# Patient Record
Sex: Male | Born: 1995 | Race: White | Hispanic: No | Marital: Single | Smoking: Never smoker
Health system: Southern US, Community
[De-identification: ages and names within clinical notes are randomized; demographics above are authoritative.]

## PROBLEM LIST (undated history)

## (undated) DIAGNOSIS — B019 Varicella without complication: Secondary | ICD-10-CM

## (undated) DIAGNOSIS — F909 Attention-deficit hyperactivity disorder, unspecified type: Secondary | ICD-10-CM

## (undated) DIAGNOSIS — H6690 Otitis media, unspecified, unspecified ear: Secondary | ICD-10-CM

## (undated) DIAGNOSIS — E230 Hypopituitarism: Secondary | ICD-10-CM

## (undated) HISTORY — PX: TYMPANOSTOMY TUBE PLACEMENT: SHX32

## (undated) HISTORY — PX: INGUINAL HERNIA REPAIR: SUR1180

## (undated) HISTORY — PX: NASAL FRACTURE SURGERY: SHX718

## (undated) HISTORY — DX: Otitis media, unspecified, unspecified ear: H66.90

## (undated) HISTORY — DX: Varicella without complication: B01.9

## (undated) HISTORY — DX: Attention-deficit hyperactivity disorder, unspecified type: F90.9

## (undated) HISTORY — DX: Hypopituitarism: E23.0

## (undated) HISTORY — PX: HERNIA REPAIR: SHX51

---
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
## 2016-04-28 IMAGING — CR DG LUMBAR SPINE 2-3V
3 series · 3 of 3 positions shown · non-contrast
Comparison: None.

CLINICAL DATA: Sharp lumbar pain 1 week worse when lying down. No
trauma. No radiculopathy.

EXAM:
LUMBAR SPINE - 2-3 VIEW

[w lumbar spine ap]
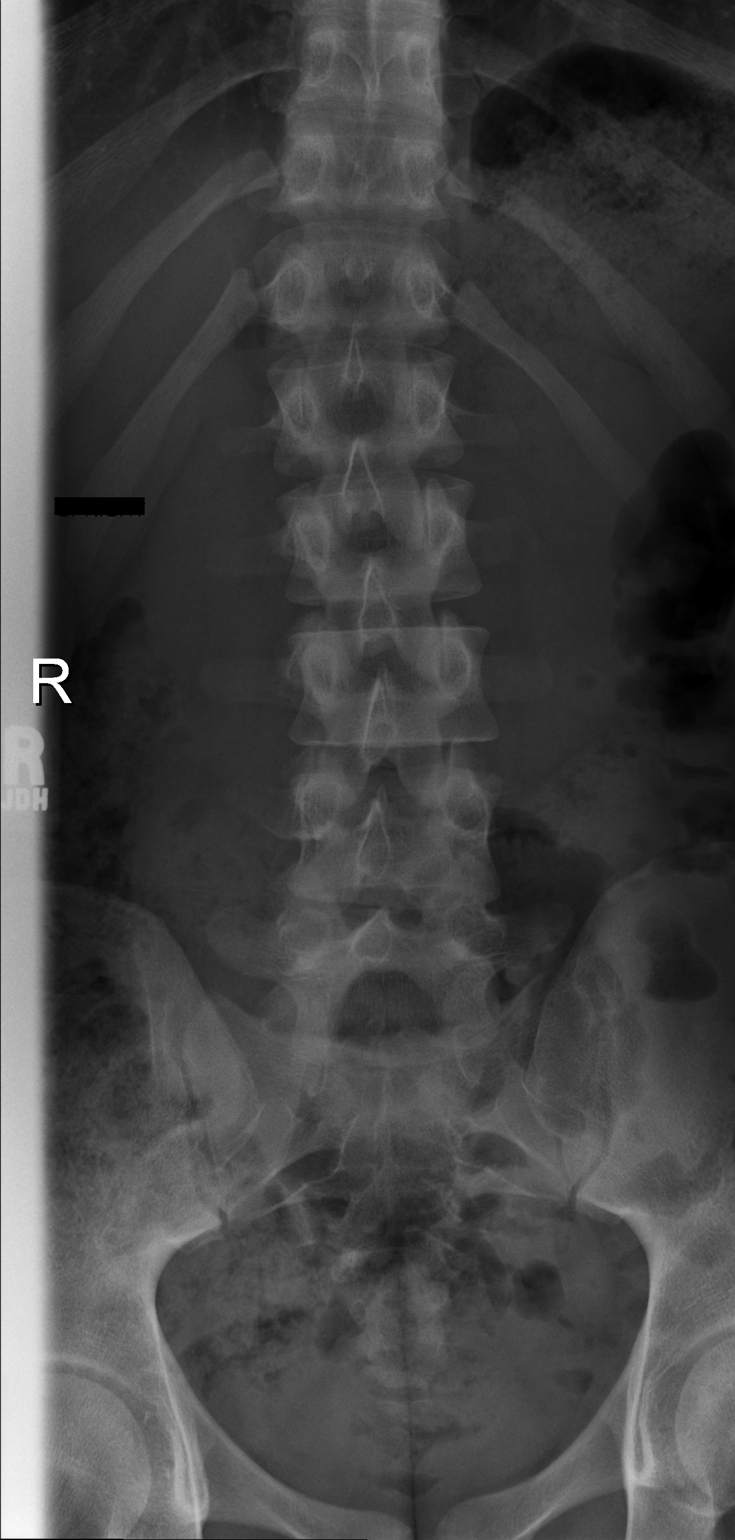

[w lumbar spine lat]
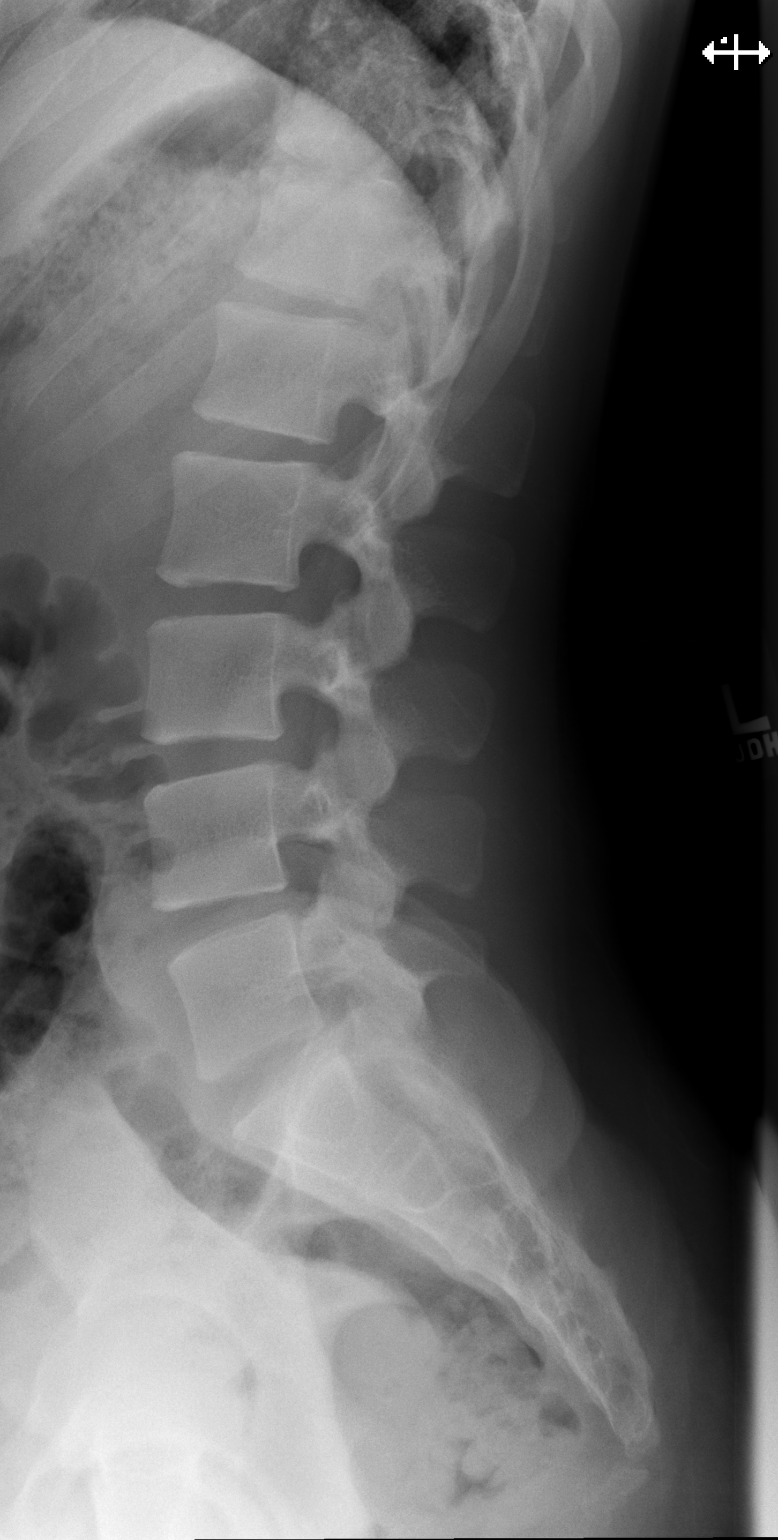

[w lumbar l-5 s-1 spot]
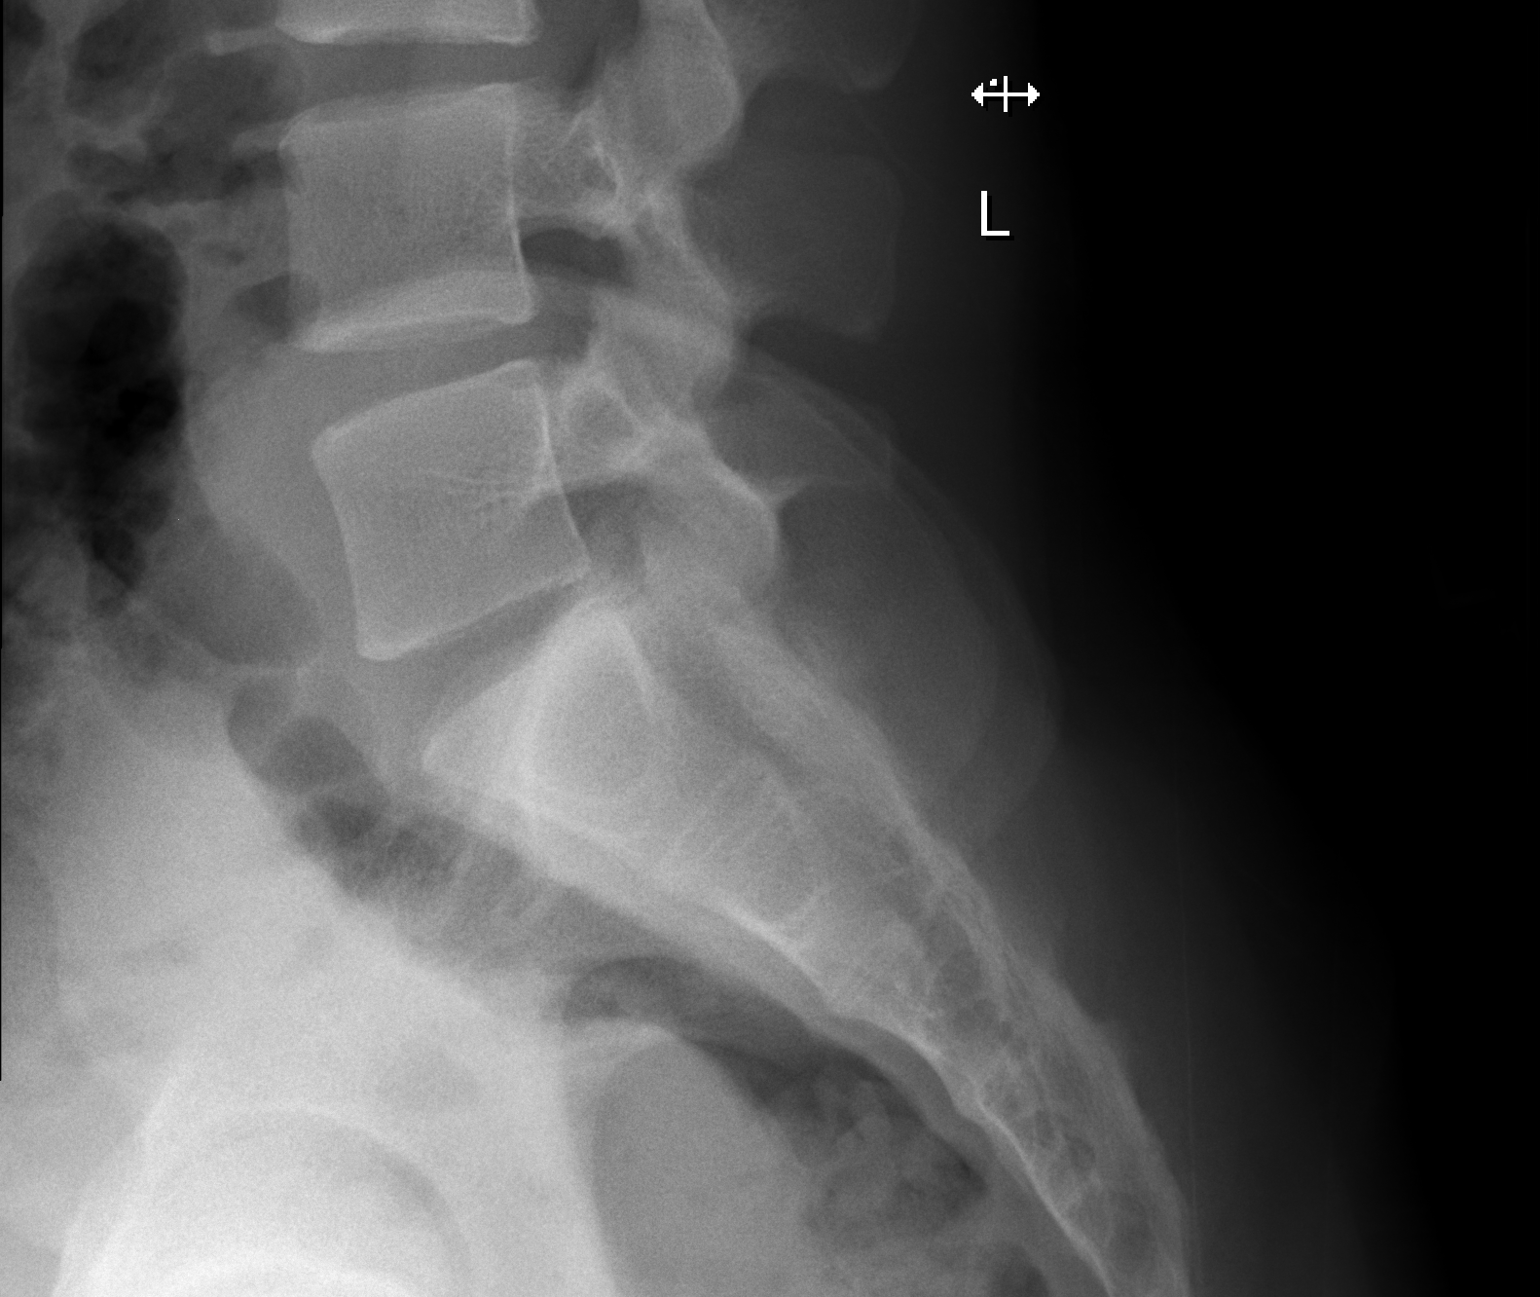

[3 of 3 positions shown; findings below may reference images not displayed]

FINDINGS: There is no evidence of lumbar spine fracture. Alignment is normal.
Intervertebral disc spaces are maintained.
IMPRESSION: Negative.

## 2016-11-22 IMAGING — CT CT HEAD W/O CM
2 series · 15 of 30 positions shown, 19 images · non-contrast
Comparison: 03/19/2015

CLINICAL DATA: Head trauma sustained riding a skateboard with loss
of consciousness. Nausea and vomiting. Followup occipital skull
fractures.

EXAM:
CT HEAD WITHOUT CONTRAST
TECHNIQUE: Contiguous axial images were obtained from the base of the skull
through the vertex without intravenous contrast.

[Series 201: head w/o, idose (1) · axial · non-contrast · 0.43mm/px · z∈[+57,+177]mm · 13 of 30 slices shown, 17 images]
[im 3/30  brain]
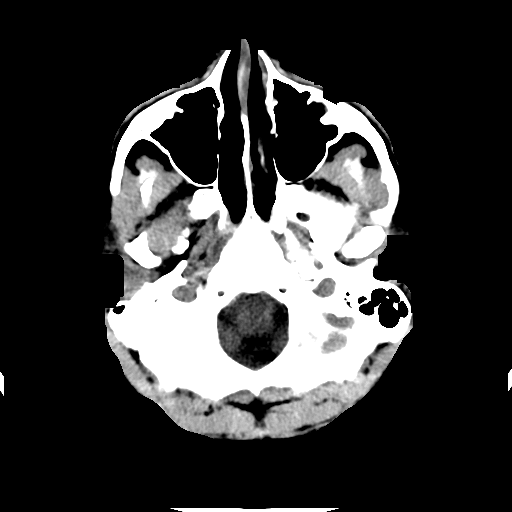
[im 3/30  bone]
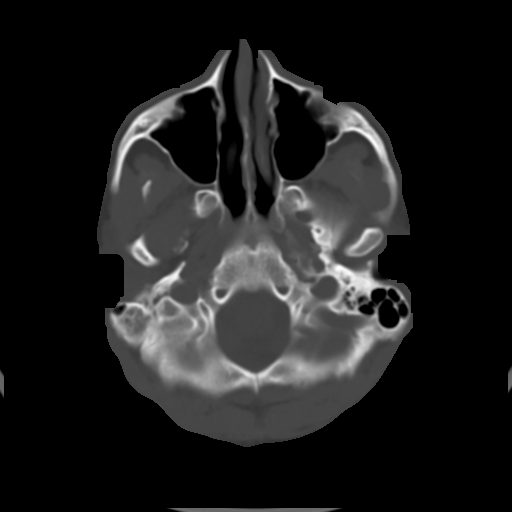
[im 5/30  brain]
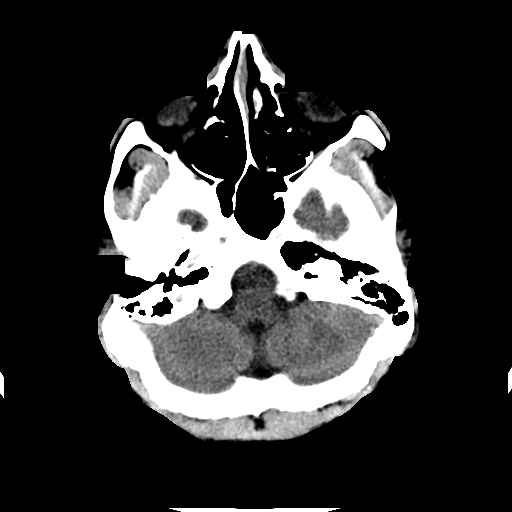
[im 7/30  brain]
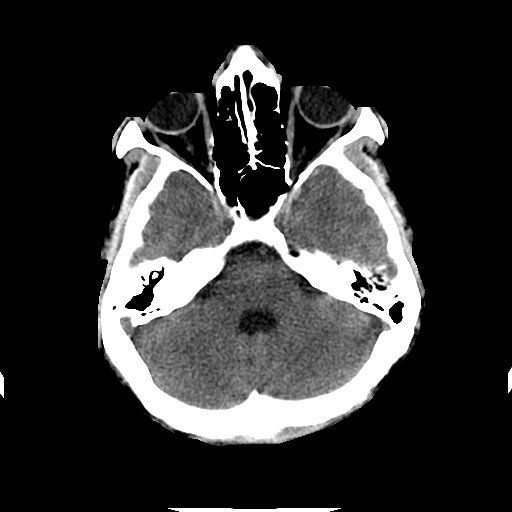
[im 9/30  brain]
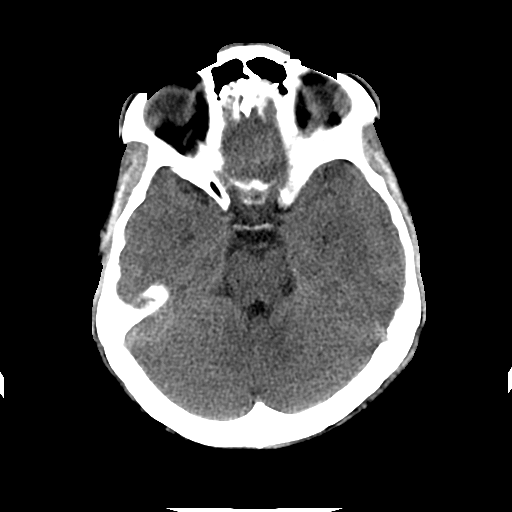
[im 11/30  brain]
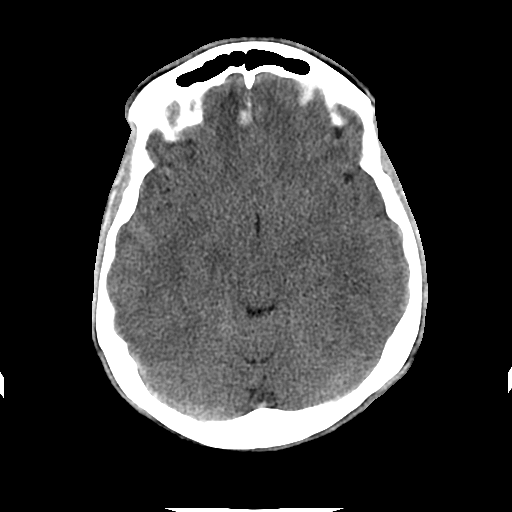
[im 11/30  bone]
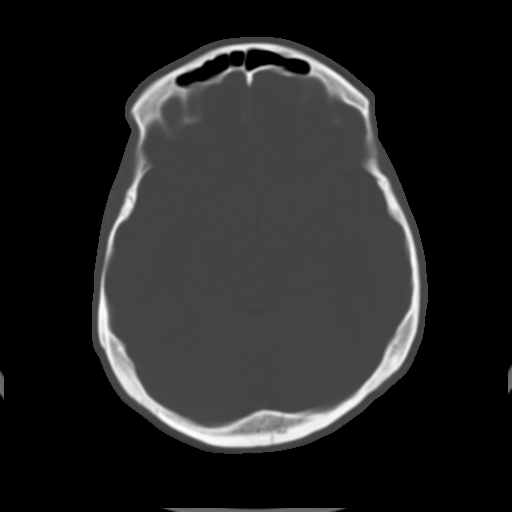
[im 13/30  brain]
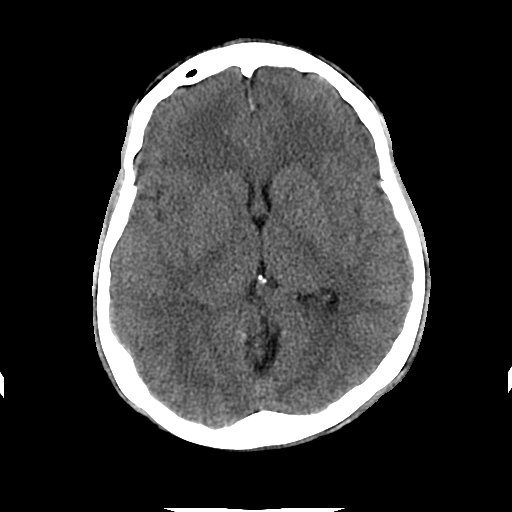
[im 15/30  brain]
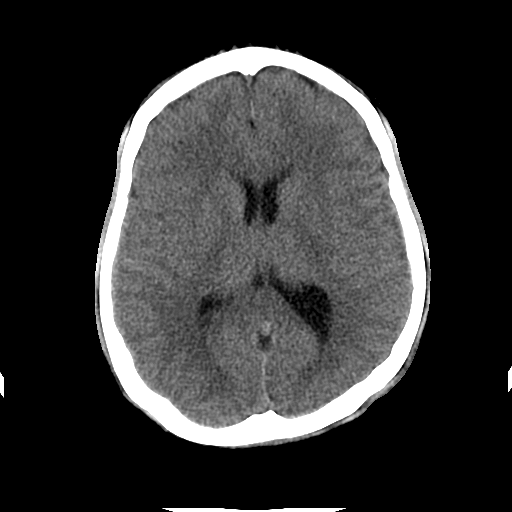
[im 17/30  brain]
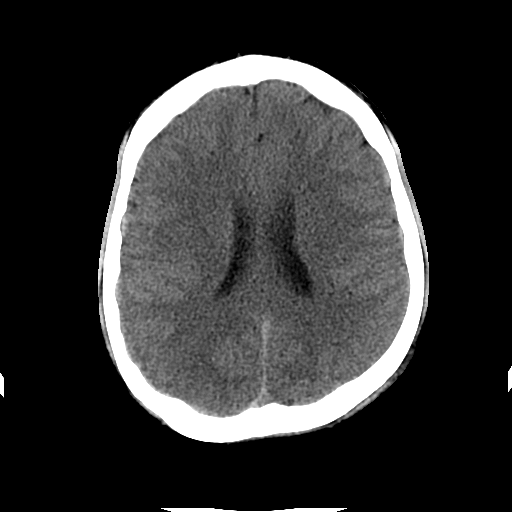
[im 19/30  brain]
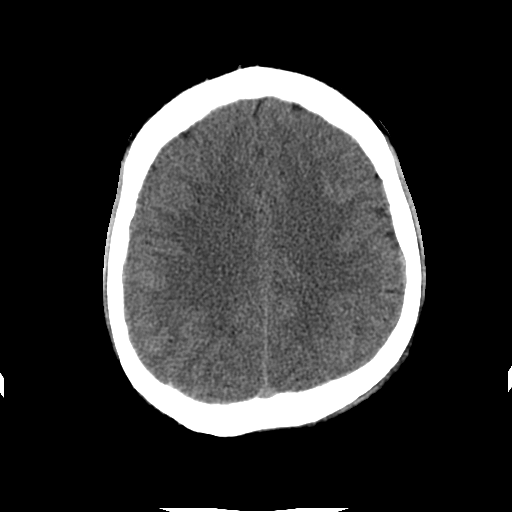
[im 19/30  bone]
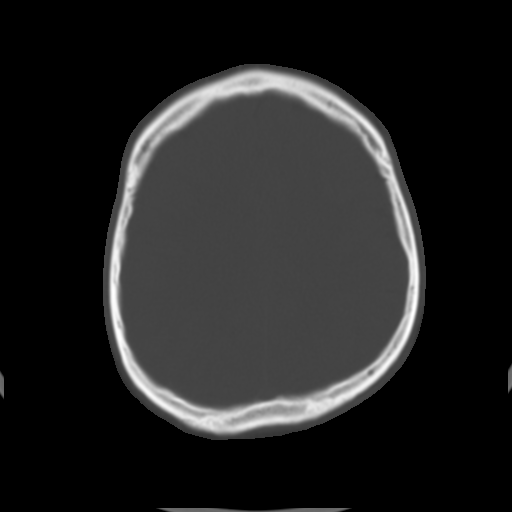
[im 21/30  brain]
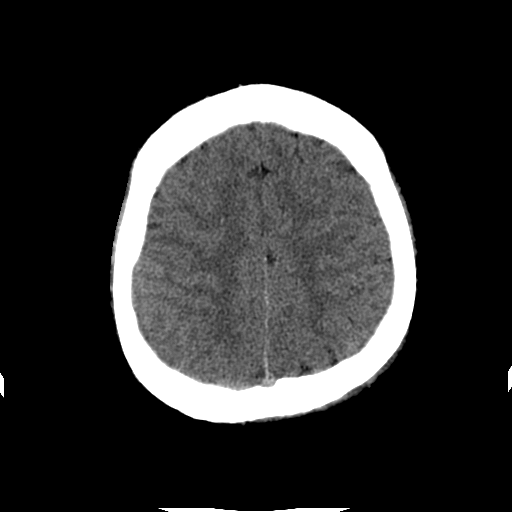
[im 23/30  brain]
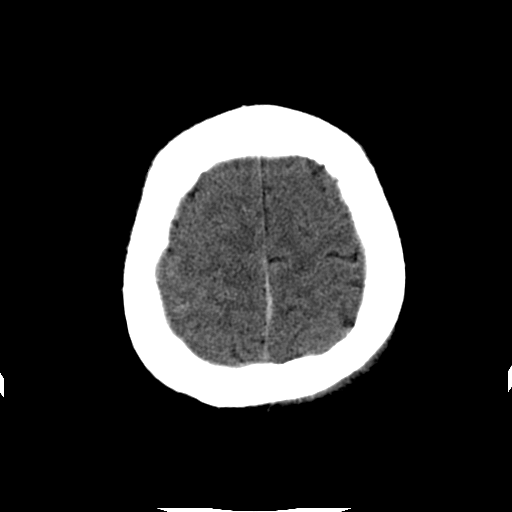
[im 25/30  brain]
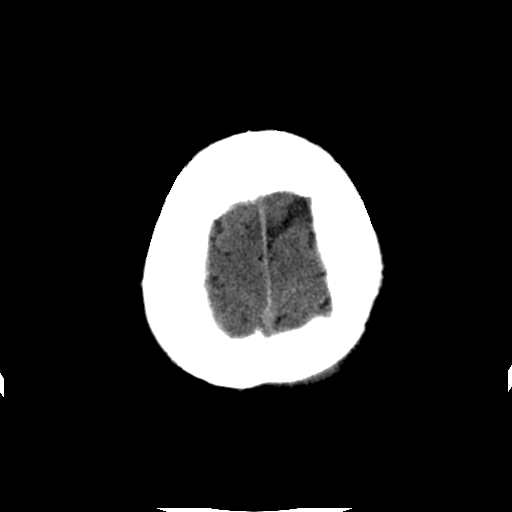
[im 27/30  brain]
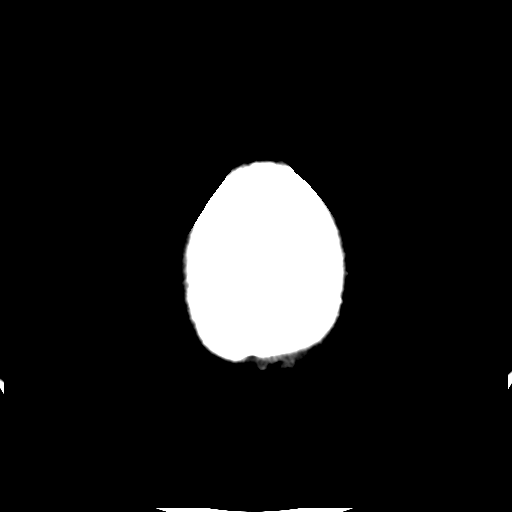
[im 27/30  bone]
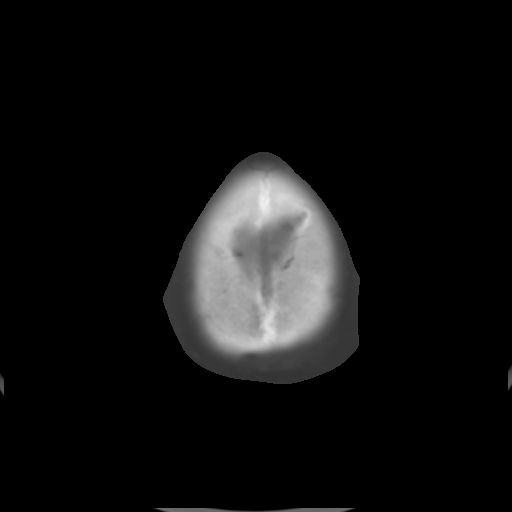

[Series 202: head w/o bone, idose (1) · axial · non-contrast · 0.43mm/px · z∈[+57,+77]mm · 2 of 30 slices shown]
[im 3/30  bone]
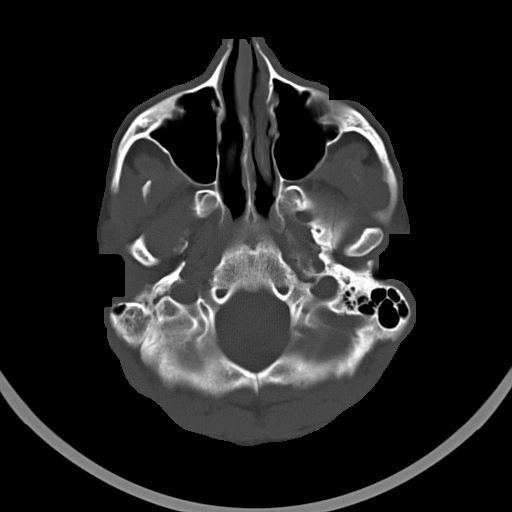
[im 7/30  bone]
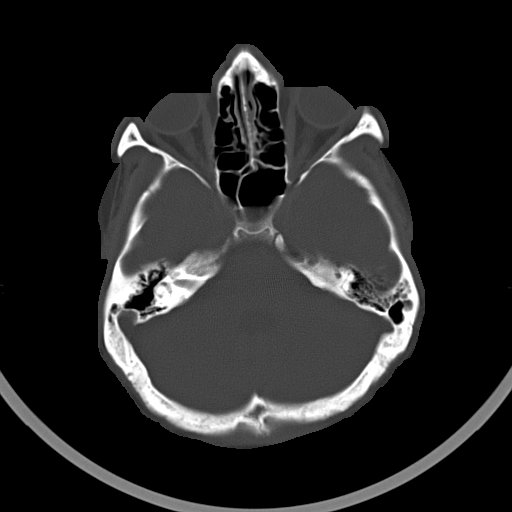

[15 of 30 positions shown; findings below may reference images not displayed]

FINDINGS: Nondisplaced occipital skull fracture as seen yesterday. Small
amount of subdural blood along the tentorium and the falx, maximal
thickness 3 mm. Scattered small amount of subarachnoid blood.
Hemorrhagic contusions of the inferior frontal lobes with the
largest hematoma measuring 6 mm. No midline shift. No hydrocephalus.
IMPRESSION: Occipital skull fracture as demonstrated yesterday.

Small amount of subdural blood along the tentorium and falx, maximal
thickness 3 mm.

Small amount of subarachnoid blood.

Hemorrhagic contusions of the inferior frontal lobes with the
largest hematoma measuring 6 mm on the right.

## 2017-02-03 ENCOUNTER — Telehealth: Payer: Self-pay | Admitting: Pediatrics

## 2017-02-03 NOTE — Telephone Encounter (Signed)
°  [facility] Duwaine MaxinStuart Levin[facility] last office note from 06/29/13. tl

## 2022-04-24 ENCOUNTER — Encounter (INDEPENDENT_AMBULATORY_CARE_PROVIDER_SITE_OTHER): Payer: Self-pay

## 2022-04-24 ENCOUNTER — Ambulatory Visit (INDEPENDENT_AMBULATORY_CARE_PROVIDER_SITE_OTHER): Payer: Commercial Managed Care - POS | Admitting: Emergency Medicine

## 2022-04-24 VITALS — BP 135/92 | HR 79 | Temp 98.1°F | Resp 18 | Ht 65.0 in | Wt 211.0 lb

## 2022-04-24 DIAGNOSIS — J988 Other specified respiratory disorders: Secondary | ICD-10-CM

## 2022-04-24 DIAGNOSIS — R0981 Nasal congestion: Secondary | ICD-10-CM

## 2022-04-24 DIAGNOSIS — Z20822 Contact with and (suspected) exposure to covid-19: Secondary | ICD-10-CM

## 2022-04-24 DIAGNOSIS — B9789 Other viral agents as the cause of diseases classified elsewhere: Secondary | ICD-10-CM

## 2022-04-24 LAB — POCT RAPID STREP A: Rapid Strep A Screen POCT: NEGATIVE

## 2022-04-24 LAB — POCT INFLUENZA A/B
POCT Rapid Influenza A AG: NEGATIVE
POCT Rapid Influenza B AG: NEGATIVE

## 2022-04-24 LAB — POC COVID QUICKVUE ANTIGEN: QuickVue SARS COV2 Antigen POCT: NEGATIVE

## 2022-04-24 NOTE — Progress Notes (Signed)
[facility]  CARE  PROGRESS NOTE     Patient: Sean Sandoval   Date: 04/24/2022   MRN: [id number]       Melih Hardeman is a 27 y.o. male      HISTORY     History obtained from: Patient    Chief Complaint   Patient presents with    Sore Throat     Onset x0 days. Sore throat, body aches, headache, congestion. Felt feverish. Pt took at home covid test, was negative.           Sore Throat   This is a new problem. The current episode started today. The problem has been unchanged. Maximum temperature: subjective fever. Associated symptoms include congestion and headaches. Pertinent negatives include no shortness of breath. Associated symptoms comments: Sore throat. He has had no exposure to strep. He has tried NSAIDs for the symptoms. The treatment provided moderate relief.        Review of Systems   HENT:  Positive for congestion.    Respiratory:  Negative for shortness of breath.    Neurological:  Positive for headaches.       History:    Pertinent Past Medical, Surgical, Family and Social History were reviewed.      No current outpatient medications on file.    No Known Allergies    Medications and Allergies reviewed.    PHYSICAL EXAM     Vitals:    04/24/22 1302   BP: (!) 135/92   BP Site: Left arm   Patient Position: Sitting   Cuff Size: Medium   Pulse: 79   Resp: 18   Temp: 98.1 F (36.7 C)   TempSrc: Oral   SpO2: 97%   Weight: 95.7 kg (211 lb)   Height: 1.651 m ('5\' 5"'$ )       Physical Exam  Constitutional:       Appearance: Normal appearance. He is not ill-appearing or toxic-appearing.   HENT:      Head: Normocephalic and atraumatic.      Nose: No rhinorrhea.      Mouth/Throat:      Mouth: Mucous membranes are moist.      Pharynx: No oropharyngeal exudate or posterior oropharyngeal erythema.      Comments: Enlarged tonsils, no erythema, no exudates    Eyes: Conjunctivae are normal. Pupils are equal, round, and reactive to light. Cardiovascular:      Rate and Rhythm: Normal rate and regular rhythm.      Pulses: Normal  pulses.   Pulmonary:      Effort: Pulmonary effort is normal. No respiratory distress.      Breath sounds: Normal breath sounds.   Musculoskeletal:         General: Normal range of motion.      Cervical back: Normal range of motion.   Neurological:      General: No focal deficit present.      Mental Status: He is alert and oriented to person, place, and time.   Psychiatric:         Mood and Affect: Mood normal.         UCC COURSE     There were no labs reviewed with this patient during the visit.    There were no x-rays reviewed with this patient during the visit.    No current facility-administered medications for this visit.       PROCEDURES     Procedures    MEDICAL DECISION MAKING  History, physical, labs/studies most consistent with viral syndrome as the diagnosis.        Chart Review:  Prior PCP, Specialist and/or ED notes reviewed today: No  Prior labs/images/studies reviewed today: No    Differential Diagnosis  Bronchitis, pneumonia, sinusitis, URI, viral syndrome, Pharyngitis, Influenza, COVID-19 and allergic rhinitis       ASSESSMENT     Encounter Diagnosis   Name Primary?    Nasal congestion Yes                PLAN      PLAN:   Rest, please increase oral fluid intake, try hot beverage/tea/soups  Gurgle with salt water and lozenges   Take motrin or tylenol for pain  OTC medication including Mucinex  Please follow up with your doctor or RTC if your symptoms worsen               Orders Placed This Encounter   Procedures    QuickVue SARS-COV-2 Antigen POCT    POCT Influenza A/B    POCT Rapid Group A Strep     Requested Prescriptions      No prescriptions requested or ordered in this encounter       Discussed results and diagnosis with patient/family.  Reviewed warning signs for worsening condition, as well as, indications for follow-up with primary care physician and return to urgent care clinic.   Patient/family expressed understanding of instructions.     An After Visit Summary was provided to the  patient.

## 2022-05-01 ENCOUNTER — Encounter (INDEPENDENT_AMBULATORY_CARE_PROVIDER_SITE_OTHER): Payer: Self-pay | Admitting: Nurse Practitioner

## 2022-05-01 ENCOUNTER — Ambulatory Visit (INDEPENDENT_AMBULATORY_CARE_PROVIDER_SITE_OTHER): Payer: Commercial Managed Care - POS | Admitting: Nurse Practitioner

## 2022-05-01 VITALS — BP 130/90 | HR 71 | Temp 98.3°F | Resp 18 | Ht 65.0 in | Wt 212.0 lb

## 2022-05-01 DIAGNOSIS — H6640 Suppurative otitis media, unspecified, unspecified ear: Secondary | ICD-10-CM

## 2022-05-01 MED ORDER — NEOMYCIN-POLYMYXIN-HC 3.5-10000-1 OT SOLN
4.0000 [drp] | Freq: Three times a day (TID) | OTIC | 0 refills | Status: AC
Start: 2022-05-01 — End: 2022-05-08

## 2022-05-01 MED ORDER — METHYLPREDNISOLONE 4 MG PO TBPK
ORAL_TABLET | ORAL | 0 refills | Status: DC
Start: 2022-05-01 — End: 2022-05-01

## 2022-05-01 MED ORDER — CEFDINIR 300 MG PO CAPS
300.0000 mg | ORAL_CAPSULE | Freq: Two times a day (BID) | ORAL | 0 refills | Status: DC
Start: 2022-05-01 — End: 2022-05-01

## 2022-05-01 MED ORDER — NEOMYCIN-POLYMYXIN-HC 3.5-10000-1 OT SOLN
4.0000 [drp] | Freq: Three times a day (TID) | OTIC | 0 refills | Status: DC
Start: 2022-05-01 — End: 2022-05-01

## 2022-05-01 MED ORDER — FLUTICASONE PROPIONATE 50 MCG/ACT NA SUSP
2.0000 | Freq: Every day | NASAL | 0 refills | Status: AC
Start: 2022-05-01 — End: 2022-06-30

## 2022-05-01 MED ORDER — METHYLPREDNISOLONE 4 MG PO TBPK
ORAL_TABLET | ORAL | 0 refills | Status: AC
Start: 2022-05-01 — End: 2022-05-07

## 2022-05-01 MED ORDER — FLUTICASONE PROPIONATE 50 MCG/ACT NA SUSP
2.0000 | Freq: Every day | NASAL | 0 refills | Status: DC
Start: 2022-05-01 — End: 2022-05-01

## 2022-05-01 MED ORDER — CEFDINIR 300 MG PO CAPS
300.0000 mg | ORAL_CAPSULE | Freq: Two times a day (BID) | ORAL | 0 refills | Status: AC
Start: 2022-05-01 — End: 2022-05-11

## 2022-05-01 NOTE — Progress Notes (Signed)
[facility]  CARE  PROGRESS NOTE     Patient: Sean Sandoval   Date: 05/01/2022   MRN: [id number]       Pantelis Castrillo is a 27 y.o. male      HISTORY     History obtained from: Patient    Chief Complaint   Patient presents with    Otalgia     Onset x7 days right side. Fullness, "feels like underwater" or pressure like going up in an air plane. Was seen for ear infection, currently being treated, however pain is not going away.           Otalgia      Pt is here for right ear pain onset 4 days. Pt reports he went to [facility] and diagnosis with right ear infection and taking Augmentin which is not helping his ear pain. Pt reports he has ear pressure and muffled hearing. Denies fever, discharge or Congestion.     Review of Systems   HENT:  Positive for ear pain.    All other systems reviewed and are negative.      History:    Pertinent Past Medical, Surgical, Family and Social History were reviewed.        Current Outpatient Medications:     Tessalon Perles 100 MG capsule, Take 1 capsule (100 mg) by mouth 3 (three) times daily as needed, Disp: , Rfl:     cefdinir (OMNICEF) 300 MG capsule, Take 1 capsule (300 mg) by mouth 2 (two) times daily for 10 days, Disp: 20 capsule, Rfl: 0    fluticasone (FLONASE) 50 MCG/ACT nasal spray, 2 sprays by Nasal route daily, Disp: 16 g, Rfl: 0    methylPREDNISolone (MEDROL DOSEPAK) 4 MG tablet, Follow package directions days 1-6.  Finish all medication as instructed., Disp: 21 tablet, Rfl: 0    neomycin-polymyxin-hydrocortisone (CORTISPORIN) otic solution, Place 4 drops into the right ear 3 (three) times daily for 7 days, Disp: 4.2 mL, Rfl: 0    No Known Allergies    Medications and Allergies reviewed.    PHYSICAL EXAM     Vitals:    05/01/22 1736   BP: 130/90   BP Site: Left arm   Patient Position: Sitting   Cuff Size: Medium   Pulse: 71   Resp: 18   Temp: 98.3 F (36.8 C)   TempSrc: Oral   SpO2: 98%   Weight: 96.2 kg (212 lb)   Height: 1.651 m ('5\' 5"'$ )       Physical  Exam  Constitutional:       Appearance: Normal appearance.   HENT:      Head: Normocephalic and atraumatic.      Right Ear: Tympanic membrane is injected, erythematous and bulging.      Left Ear: Tympanic membrane is scarred.      Nose: Nose normal.   Cardiovascular:      Rate and Rhythm: Normal rate and regular rhythm.   Pulmonary:      Effort: Pulmonary effort is normal.      Breath sounds: Normal breath sounds.   Neurological:      Mental Status: He is alert and oriented to person, place, and time.   Psychiatric:         Mood and Affect: Mood normal.         Behavior: Behavior normal.   Vitals and nursing note reviewed.         UCC COURSE     There were  no labs reviewed with this patient during the visit.    There were no x-rays reviewed with this patient during the visit.    No current facility-administered medications for this visit.       PROCEDURES     Procedures    MEDICAL DECISION MAKING     History, physical, labs/studies most consistent with OM as the diagnosis.        Chart Review:  Prior PCP, Specialist and/or ED notes reviewed today: No  Prior labs/images/studies reviewed today: No    Differential Diagnosis: AOM, OE, mastoiditis, FB, TM rupture        ASSESSMENT     Encounter Diagnosis   Name Primary?    Suppurative otitis media without rupture of ear drum, unspecified laterality Yes                PLAN      PLAN:   Findings consistent with acute otitis media. Advised to stop Augmentin since it is not helping. Abx changed to Cefdinir and discussed possible adverse effects  Acetaminophen or NSAIDs for pain/fever control  Seek further medical evaluation if symptoms do not improve in 48-72 hours following initiating abx treatment  Patient/parent verbalized understanding of information and had no further questions/concerns.               No orders of the defined types were placed in this encounter.    Requested Prescriptions     Signed Prescriptions Disp Refills    cefdinir (OMNICEF) 300 MG capsule 20  capsule 0     Sig: Take 1 capsule (300 mg) by mouth 2 (two) times daily for 10 days    fluticasone (FLONASE) 50 MCG/ACT nasal spray 16 g 0     Sig: 2 sprays by Nasal route daily    methylPREDNISolone (MEDROL DOSEPAK) 4 MG tablet 21 tablet 0     Sig: Follow package directions days 1-6.  Finish all medication as instructed.    neomycin-polymyxin-hydrocortisone (CORTISPORIN) otic solution 4.2 mL 0     Sig: Place 4 drops into the right ear 3 (three) times daily for 7 days       Discussed results and diagnosis with patient/family.  Reviewed warning signs for worsening condition, as well as, indications for follow-up with primary care physician and return to urgent care clinic.   Patient/family expressed understanding of instructions.     An After Visit Summary was provided to the patient.
